# Patient Record
Sex: Female | Born: 1985 | Race: White | Hispanic: No | Marital: Single | Smoking: Current every day smoker
Health system: Southern US, Community
[De-identification: ages and names within clinical notes are randomized; demographics above are authoritative.]

## PROBLEM LIST (undated history)

## (undated) DIAGNOSIS — S42402A Unspecified fracture of lower end of left humerus, initial encounter for closed fracture: Secondary | ICD-10-CM

## (undated) DIAGNOSIS — S42401A Unspecified fracture of lower end of right humerus, initial encounter for closed fracture: Secondary | ICD-10-CM

## (undated) HISTORY — PX: TONSILLECTOMY: SUR1361

## (undated) HISTORY — PX: APPENDECTOMY: SHX54

---
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
## 2005-12-18 ENCOUNTER — Inpatient Hospital Stay (HOSPITAL_COMMUNITY): Admission: AD | Admit: 2005-12-18 | Discharge: 2005-12-18 | Payer: Self-pay | Admitting: Obstetrics & Gynecology

## 2006-01-12 ENCOUNTER — Emergency Department (HOSPITAL_COMMUNITY): Admission: EM | Admit: 2006-01-12 | Discharge: 2006-01-12 | Payer: Self-pay | Admitting: Emergency Medicine

## 2008-04-08 ENCOUNTER — Ambulatory Visit: Payer: Self-pay | Admitting: Family Medicine

## 2008-04-08 ENCOUNTER — Inpatient Hospital Stay (HOSPITAL_COMMUNITY): Admission: AD | Admit: 2008-04-08 | Discharge: 2008-04-09 | Payer: Self-pay | Admitting: Obstetrics and Gynecology

## 2008-04-15 ENCOUNTER — Inpatient Hospital Stay (HOSPITAL_COMMUNITY): Admission: AD | Admit: 2008-04-15 | Discharge: 2008-04-15 | Payer: Self-pay | Admitting: Obstetrics and Gynecology

## 2008-04-18 ENCOUNTER — Inpatient Hospital Stay (HOSPITAL_COMMUNITY): Admission: AD | Admit: 2008-04-18 | Discharge: 2008-04-21 | Payer: Self-pay | Admitting: Obstetrics and Gynecology

## 2008-04-19 ENCOUNTER — Encounter (INDEPENDENT_AMBULATORY_CARE_PROVIDER_SITE_OTHER): Payer: Self-pay | Admitting: Obstetrics and Gynecology

## 2010-01-17 ENCOUNTER — Encounter
Admission: RE | Admit: 2010-01-17 | Discharge: 2010-01-17 | Payer: Self-pay | Source: Home / Self Care | Attending: Family Medicine | Admitting: Family Medicine

## 2010-05-04 LAB — CBC
HCT: 28.1 % — ABNORMAL LOW (ref 36.0–46.0)
HCT: 32.8 % — ABNORMAL LOW (ref 36.0–46.0)
Hemoglobin: 11.4 g/dL — ABNORMAL LOW (ref 12.0–15.0)
Hemoglobin: 9.5 g/dL — ABNORMAL LOW (ref 12.0–15.0)
MCHC: 33.8 g/dL (ref 30.0–36.0)
MCHC: 34.7 g/dL (ref 30.0–36.0)
MCV: 93.8 fL (ref 78.0–100.0)
MCV: 94.7 fL (ref 78.0–100.0)
Platelets: 199 10*3/uL (ref 150–400)
Platelets: 234 10*3/uL (ref 150–400)
RBC: 2.97 MIL/uL — ABNORMAL LOW (ref 3.87–5.11)
RBC: 3.5 MIL/uL — ABNORMAL LOW (ref 3.87–5.11)
RDW: 13.1 % (ref 11.5–15.5)
RDW: 13.1 % (ref 11.5–15.5)
WBC: 17.9 10*3/uL — ABNORMAL HIGH (ref 4.0–10.5)
WBC: 26.3 10*3/uL — ABNORMAL HIGH (ref 4.0–10.5)

## 2010-05-04 LAB — COMPREHENSIVE METABOLIC PANEL
ALT: 13 U/L (ref 0–35)
AST: 19 U/L (ref 0–37)
Albumin: 2.5 g/dL — ABNORMAL LOW (ref 3.5–5.2)
Alkaline Phosphatase: 207 U/L — ABNORMAL HIGH (ref 39–117)
BUN: 5 mg/dL — ABNORMAL LOW (ref 6–23)
CO2: 25 mEq/L (ref 19–32)
Calcium: 9.1 mg/dL (ref 8.4–10.5)
Chloride: 104 mEq/L (ref 96–112)
Creatinine, Ser: 0.6 mg/dL (ref 0.4–1.2)
GFR calc Af Amer: >60 mL/min (ref >60)
GFR calc non Af Amer: >60 mL/min (ref >60)
Glucose, Bld: 96 mg/dL (ref 70–99)
Potassium: 3.8 mEq/L (ref 3.5–5.1)
Sodium: 136 mEq/L (ref 135–145)
Total Bilirubin: 0.3 mg/dL (ref 0.3–1.2)
Total Protein: 5.9 g/dL — ABNORMAL LOW (ref 6.0–8.3)

## 2010-05-04 LAB — LACTATE DEHYDROGENASE: LDH: 186 U/L (ref 94–250)

## 2010-05-04 LAB — RPR: RPR Ser Ql: NONREACTIVE

## 2010-05-04 LAB — URIC ACID: Uric Acid, Serum: 5.3 mg/dL (ref 2.4–7.0)

## 2010-06-06 NOTE — Discharge Summary (Signed)
[name], [name]            ACCOUNT NO.:  [id number]   MEDICAL RECORD NO.:  [id number]          PATIENT TYPE:  INP   LOCATION:  9121                          FACILITY:  [facility]   PHYSICIAN:  Sherron Monday, MD        DATE OF BIRTH:  [birth year]   DATE OF ADMISSION:  04/18/2008  DATE OF DISCHARGE:  04/21/2008                               DISCHARGE SUMMARY   ADMITTING DIAGNOSES:  1. Intrauterine pregnancy at term with spontaneous rupture of      membranes.  2. Pregnancy pruritic urticarial papules and plaques (PUPPs).   DISCHARGE DIAGNOSIS:  Infant delivered via spontaneous vaginal delivery.   HISTORY OF PRESENT ILLNESS:  A 25 year old G1, P0 at 31 and 9 plus weeks  with a last menstrual period consistent with an 8-week ultrasound for an  [facility] of April 23, 2008, presents with increasing contractions with  intensity and frequency.  No vaginal bleeding.  On arrival to the  maternity admissions unit, she had a gush of fluid and was evaluated and  spontaneous rupture of membranes was confirmed.  Vaginal exam was 1-2  cm.  It was dilated, 70% effaced and -2 station with contractions every  3-5 minutes.  Prenatal care was essentially uncomplicated.   PAST MEDICAL HISTORY:  Significant for migraines, depression, and  anxiety.   PAST SURGICAL HISTORY:  Significant for tonsillectomy, adenoidectomy, as  well as appendectomy.   PAST OB/GYN HISTORY:  G1, is present pregnancy.  No abnormal Pap smears  or sexually transmitted diseases.   MEDICATIONS:  None.   ALLERGIES:  No known drug allergies.   SOCIAL HISTORY:  The patient denies alcohol, tobacco, or drug use.   PHYSICAL EXAMINATION:  On admission, she is afebrile.  Vital signs  stable with a mildly elevated blood pressure with normal PIH labs.  Fetal heart tones were reactive with contractions with a vaginal exam of  1-2 cm dilated, 70% effaced, and -2 station. On admission and had been  noted at the tail end of her prenatal care she  had PUPPs that had been  worked up and bile salts were negative.  She had been taking Benadryl  and Atarax to control the itching.   PRENATAL LABORATORIES:  RPR nonreactive.  Hepatitis B surface antigen  negative.  Rubella immune.  HIV negative.  A positive.  Antibody screen  negative.  Gonorrhea negative.  Chlamydia negative.  Cystic fibrosis  screen negative.  Glucola 125.  Group B strep was negative.   Her labor was slow to progress.  Pitocin was started to augment.  A  forebag  was ruptured for clear malodorous fluid.  She continued to  progress in her labor.  Throughout her labor, she had an axillary  temperature of 99.5 and a malodorous rupture of membranes, therefore it  was decided to start ampicillin and gentamicin to treat for probable  chorioamnionitis.  She progressed to complete, plus 2.  She pushed for  approximately an hour to deliver a viable female infant at 25 on the  29th with Apgars of 8 at one minute and 8 at five minutes and  weight of  7 pounds 6 ounces.  No lacerations were noted.  EBL was less than 500  mL.  Placenta was sent to Pathology.  In the course of her labor, she  had moderate variability and occasional late versus early decelerations.  She was closely monitored throughout her labor.  Her postpartum course  was relatively uncomplicated.  She remained afebrile with stable vital  signs throughout.  She was discharged home on postpartum day #2 having  no complaints.  Her pain was well controlled.  She had normal lochia.  She was voiding and ambulating well.  Her hemoglobin decreased from 11.4  to 9.5.   She was discharged to home with prescriptions for Motrin, Vicodin,  prenatal vitamins, routine discharge instructions, and numbers to call  if any questions or problems.   DISCHARGE INFORMATION:  She is A positive.  Rubella immune.  Plans to  breast-feed.  Her hemoglobin decreased from 11.4-9.5 and she wants an  IUD at her postpartum checkup.       Sherron Monday, MD  Electronically Signed     JB/MEDQ  D:  04/21/2008  T:  04/21/2008  Job:  [id number]

## 2010-06-09 NOTE — Op Note (Signed)
NAME:  [name], [name]                      ACCOUNT NO.:  [id number]   MEDICAL RECORD NO.:  [id number]                   PATIENT TYPE:  AMB   LOCATION:  DSC                                  FACILITY:  [facility]   PHYSICIAN:  Jefry H. Pollyann Kennedy, M.D.                DATE OF BIRTH:  10/20/1985   DATE OF PROCEDURE:  09/07/2003  DATE OF DISCHARGE:                                 OPERATIVE REPORT   PREOPERATIVE DIAGNOSIS:  Chronic tonsillitis and recurrent peritonsillar  abscess - bilaterally.   POSTOPERATIVE DIAGNOSIS:  Chronic tonsillitis and recurrent peritonsillar  abscess - bilaterally.   PROCEDURE:  Tonsillectomy.   SURGEON:  Jefry H. Pollyann Kennedy, M.D.   ANESTHESIA:  General endotracheal anesthesia.   COMPLICATIONS:  None.   ESTIMATED BLOOD LOSS:  30 mL.   FINDINGS:  Enlarged tonsils bilaterally, left side with peritonsillar  scarring and granulation tissue on the right side with peritonsillar abscess  containing thick purulent material superiorly in the peritonsillar space.   DISPOSITION:  The patient tolerated the procedure well, was awakened,  extubated, and transferred to the recovery room in stable condition.   HISTORY:  This is an 25 year old girl with a history of recurrent and  chronic tonsillitis who had a left peritonsillar abscess opened and drained  in the emergency department by myself about two weeks prior and as she was  recovering from that, developed a similar abscess on the right side.  The  decision was made to perform tonsillectomy at this point.  The risks,  benefits, alternatives, and complications of the procedure were explained to  the parents who seemed to understand and agreed to the surgery.   PROCEDURE:  The patient was taken to the operating room and placed on the  operating table in supine position.  Following induction of general  endotracheal anesthesia, the table was turned 90 degrees and the patient was  draped in standard fashion.  A Crowe-Davis  mouth gag was inserted into the  oral cavity and used to retract the tongue and mandible and attached to the  Mayo stand.  Red rubber catheter was inserted through the right side of the  nose and withdrawn through the mouth and used to retract the soft palate and  uvula.  Bilateral tonsillectomy was performed using a combination of  electrocautery dissection and blunt dissection using a Pension scheme manager.  Thick  purulent material was obtained from the right peritonsillar space  superiorly.  Approximately 2-3 mL of pus was present.  The tonsils were  removed bilaterally and sent for pathologic evaluation.  Multiple bleeding  sites were cauterized with the  suction cautery and adequate hemostasis was achieved.  The pharynx was  suctioned of blood and secretions, irrigated with saline solution, and an  orogastric tube was used to aspirate the contents of the stomach.  The  patient was then awakened, extubated, and transferred to the recovery room  in stable condition.  Jefry H. Pollyann Kennedy, M.D.    JHR/MEDQ  D:  09/07/2003  T:  09/07/2003  Job:  [id number]

## 2010-06-09 NOTE — Consult Note (Signed)
NAME:  [name], [name]                      ACCOUNT NO.:  [id number]   MEDICAL RECORD NO.:  [id number]                   PATIENT TYPE:  EMS   LOCATION:  MINO                                 FACILITY:  [facility]   PHYSICIAN:  Jefry H. Pollyann Kennedy, M.D.                DATE OF BIRTH:  04-Nov-1985   DATE OF CONSULTATION:  08/16/2003  DATE OF DISCHARGE:  08/16/2003                                   CONSULTATION   REASON FOR CONSULTATION:  Suspected peritonsillar abscess.   HISTORY OF PRESENT ILLNESS:  This is an 25 year old young lady who developed  a sore throat about one week ago.  She was seen at a local [facility].  Strep test was negative and was prescribed Zithromax.  She took two days and  could not finish it due to side-effects mainly GI related.  She then started  getting much worse a couple of days ago and went to the local emergency room  in [facility] where she was prescribed nonsteroidal anti-inflammatory  medications for pain.  She got worse today and was not able to eat or drink  anything.  She has left ear pain and some trismus.  She has no prior history  of tonsil or throat problems.  She is on no medications and has no allergies  to medicine.   PAST MEDICAL HISTORY:  Noncontributory.   PAST SURGICAL HISTORY:  Noncontributory.   PHYSICAL EXAMINATION:  GENERAL:  She is an ill-appearing young lady without  respiratory distress.  HEENT:  She has severe left-sided sore throat.  NECK:  She has some tender adenopathy left upper jugular area.  Oral cavity  and pharynx reveals severe swelling of the left side of the soft palate with  displacement of the uvula.  Tonsil is enlarged as well.  There is some  exudate on the surface.   IMPRESSION:  Suspicious for peritonsillar abscess on the left.   PLAN:  Recommend incision and drainage.   DESCRIPTION OF PROCEDURE:  A 1% Xylocaine with epinephrine was infiltrated  into the left soft palate and anterior fossa arch.  An 18 gauge  needle was  used to aspirate from the peritonsillar space and a large amount of a very  thick purulent material was obtained.  A #11 scalpel was used to incise the  mucosa over the soft palate overlying the peritonsillar plane and tonsillar  hemostat was used to spread open the peritonsillar space where more thick  purulent material was obtained.  After multiple blunt dissections, the  tonsil was completely evacuated from a very deep peritonsillar abscess  cavity.  She tolerated this well and felt some immediate relief.  She was  given a liter of normal saline in the emergency department and this was  going to be repeated if she did not void.   DISCHARGE MEDICATIONS:  1. A prescription given or Augmentin suspension.  2. Lortab elixir.  3. Phenergan suppository  p.r.n.   FOLLOW UP:  She will follow up with me in two to three days or sooner if she  gets any worse.                                               Jefry H. Pollyann Kennedy, M.D.    JHR/MEDQ  D:  08/16/2003  T:  08/16/2003  Job:  [id number]

## 2010-08-03 ENCOUNTER — Emergency Department (HOSPITAL_COMMUNITY)
Admission: EM | Admit: 2010-08-03 | Discharge: 2010-08-03 | Disposition: A | Discharge status: Home / Self Care | Payer: BC Managed Care – PPO | Attending: Emergency Medicine | Admitting: Emergency Medicine

## 2010-08-03 DIAGNOSIS — M549 Dorsalgia, unspecified: Secondary | ICD-10-CM | POA: Insufficient documentation

## 2010-08-03 DIAGNOSIS — R0602 Shortness of breath: Secondary | ICD-10-CM | POA: Insufficient documentation

## 2010-08-03 DIAGNOSIS — F410 Panic disorder [episodic paroxysmal anxiety] without agoraphobia: Secondary | ICD-10-CM | POA: Insufficient documentation

## 2010-08-03 DIAGNOSIS — M26609 Unspecified temporomandibular joint disorder, unspecified side: Secondary | ICD-10-CM | POA: Insufficient documentation

## 2010-08-03 DIAGNOSIS — R079 Chest pain, unspecified: Secondary | ICD-10-CM | POA: Insufficient documentation

## 2010-08-03 DIAGNOSIS — F909 Attention-deficit hyperactivity disorder, unspecified type: Secondary | ICD-10-CM | POA: Insufficient documentation

## 2010-08-03 LAB — RAPID URINE DRUG SCREEN, HOSP PERFORMED
Amphetamines: NOT DETECTED
Barbiturates: NOT DETECTED
Benzodiazepines: NOT DETECTED
Cocaine: NOT DETECTED
Opiates: NOT DETECTED
Tetrahydrocannabinol: NOT DETECTED

## 2010-08-03 LAB — URINALYSIS, ROUTINE W REFLEX MICROSCOPIC
Bilirubin Urine: NEGATIVE
Glucose, UA: NEGATIVE mg/dL
Hgb urine dipstick: NEGATIVE
Ketones, ur: NEGATIVE mg/dL
Specific Gravity, Urine: 1.006 (ref 1.005–1.030)
Urobilinogen, UA: 0.2 mg/dL (ref 0.0–1.0)
pH: 8 (ref 5.0–8.0)

## 2010-08-04 NOTE — ED Provider Notes (Signed)
History     No chief complaint on file.  HPI  No past medical history on file.  No past surgical history on file.  No family history on file.  History  Substance Use Topics  . Smoking status: Not on file  . Smokeless tobacco: Not on file  . Alcohol Use: Not on file    OB History    No data available      Review of Systems  Physical Exam  There were no vitals taken for this visit.  Physical Exam  ED Course  Procedures  MDM Duplicate note, entered in error      Kathleen Melter, MD 08/06/10 [phone]

## 2012-12-03 ENCOUNTER — Emergency Department (HOSPITAL_COMMUNITY)
Admission: EM | Admit: 2012-12-03 | Discharge: 2012-12-03 | Disposition: A | Discharge status: Home / Self Care | Payer: BC Managed Care – PPO | Attending: Emergency Medicine | Admitting: Emergency Medicine

## 2012-12-03 ENCOUNTER — Emergency Department (HOSPITAL_COMMUNITY): Payer: BC Managed Care – PPO

## 2012-12-03 ENCOUNTER — Encounter (HOSPITAL_COMMUNITY): Payer: Self-pay | Admitting: Emergency Medicine

## 2012-12-03 DIAGNOSIS — S93402A Sprain of unspecified ligament of left ankle, initial encounter: Secondary | ICD-10-CM

## 2012-12-03 DIAGNOSIS — R296 Repeated falls: Secondary | ICD-10-CM | POA: Insufficient documentation

## 2012-12-03 DIAGNOSIS — R269 Unspecified abnormalities of gait and mobility: Secondary | ICD-10-CM | POA: Insufficient documentation

## 2012-12-03 DIAGNOSIS — S93409A Sprain of unspecified ligament of unspecified ankle, initial encounter: Secondary | ICD-10-CM | POA: Insufficient documentation

## 2012-12-03 DIAGNOSIS — F17200 Nicotine dependence, unspecified, uncomplicated: Secondary | ICD-10-CM | POA: Insufficient documentation

## 2012-12-03 DIAGNOSIS — Y929 Unspecified place or not applicable: Secondary | ICD-10-CM | POA: Insufficient documentation

## 2012-12-03 DIAGNOSIS — Y9302 Activity, running: Secondary | ICD-10-CM | POA: Insufficient documentation

## 2012-12-03 DIAGNOSIS — X500XXA Overexertion from strenuous movement or load, initial encounter: Secondary | ICD-10-CM | POA: Insufficient documentation

## 2012-12-03 MED ORDER — KETOROLAC TROMETHAMINE 60 MG/2ML IM SOLN
60.0000 mg | Freq: Once | INTRAMUSCULAR | Status: AC
  Administered 2012-12-03: 60 mg via INTRAMUSCULAR
  Filled 2012-12-03: qty 2

## 2012-12-03 MED ORDER — KETOROLAC TROMETHAMINE 10 MG PO TABS: 10.0000 mg | ORAL_TABLET | Freq: Four times a day (QID) | ORAL | Status: DC | PRN

## 2012-12-03 NOTE — ED Provider Notes (Signed)
CSN: [id number]     Arrival date & time 12/03/12  1001 History   First MD Initiated Contact with Patient 12/03/12 1039     Chief Complaint  Patient presents with  . Foot Pain   (Consider location/radiation/quality/duration/timing/severity/associated sxs/prior Treatment) HPI Comments: Patient is a 27 year old female who presents today for left ankle pain. While running 3 miles yesterday she fell at about a mile and a half. She had an inversion injury to her left ankle. She continued with her run, but the pain continued to worsen. By the end of her run she was barely able to bear weight on her left foot. She took ibuprofen last night with little relief to her pain. Her pain is sharp and radiates up into her leg when she bears weight. She has never injured this ankle in the past. No fever, chills, nausea, vomiting, abdominal pain, chest pain, shortness of breath, numbness, weakness, paresthesias.  Patient is a 27 y.o. female presenting with lower extremity pain. The history is provided by the patient. No language interpreter was used.  Foot Pain Associated symptoms include arthralgias. Pertinent negatives include no chest pain, chills or fever.    History reviewed. No pertinent past medical history. History reviewed. No pertinent past surgical history. History reviewed. No pertinent family history. History  Substance Use Topics  . Smoking status: Current Every Day Smoker  . Smokeless tobacco: Not on file  . Alcohol Use: Yes     Comment: occ   OB History   Grav Para Term Preterm Abortions TAB SAB Ect Mult Living                 Review of Systems  Constitutional: Negative for fever and chills.  Respiratory: Negative for shortness of breath.   Cardiovascular: Negative for chest pain.  Musculoskeletal: Positive for arthralgias and gait problem.  All other systems reviewed and are negative.    Allergies  Review of patient's allergies indicates not on file.  Home Medications    Current Outpatient Rx  Name  Route  Sig  Dispense  Refill  . ketorolac (TORADOL) 10 MG tablet   Oral   Take 1 tablet (10 mg total) by mouth every 6 (six) hours as needed.   20 tablet   0    BP 133/73  Pulse 96  Temp(Src) 97.8 F (36.6 C) (Oral)  Resp 18  Ht 5\' 7"  (1.702 m)  Wt 156 lb (70.761 kg)  BMI 24.43 kg/m2  SpO2 98% Physical Exam  Nursing note and vitals reviewed. Constitutional: She is oriented to person, place, and time. She appears well-developed and well-nourished. No distress.  HENT:  Head: Normocephalic and atraumatic.  Right Ear: External ear normal.  Left Ear: External ear normal.  Nose: Nose normal.  Mouth/Throat: Oropharynx is clear and moist.  Eyes: Conjunctivae are normal.  Neck: Normal range of motion.  Cardiovascular: Normal rate, regular rhythm, normal heart sounds, intact distal pulses and normal pulses.   Pulses:      Dorsalis pedis pulses are 2+ on the right side, and 2+ on the left side.       Posterior tibial pulses are 2+ on the right side, and 2+ on the left side.  Pulmonary/Chest: Effort normal and breath sounds normal. No stridor. No respiratory distress. She has no wheezes. She has no rales.  Abdominal: Soft. She exhibits no distension.  Musculoskeletal: Normal range of motion.       Feet:  Tender to palpation over lateral aspect of left  ankle. Mild bruising seen at the lateral aspect inferior to the lateral malleolus. Neurovascularly intact. Sensation intact. Compartment soft. Range of motion mildly limited due to pain.  Neurological: She is alert and oriented to person, place, and time. She has normal strength.  Skin: Skin is warm and dry. She is not diaphoretic. No erythema.  Psychiatric: She has a normal mood and affect. Her behavior is normal.    ED Course  Procedures (including critical care time) Labs Review Labs Reviewed - No data to display Imaging Review Dg Foot Complete Left  12/03/2012   CLINICAL DATA:  Left foot injury  now with lateral pain with weight-bearing.  EXAM: LEFT FOOT - COMPLETE 3+ VIEW  COMPARISON:  None.  FINDINGS: The bones of the foot appear adequately mineralized for age. There is no evidence of an acute phalanx healed fracture. The metatarsals are intact. Specific attention to the 5th metatarsal reveals no acute abnormality. The bones of the hindfoot also appear intact.  IMPRESSION: There is no acute bony abnormality of the left foot   Electronically Signed   By: David  [place]   On: 12/03/2012 10:36    EKG Interpretation   None       MDM   1. Ankle sprain, left, initial encounter    Imaging shows no fracture. Directed pt to ice injury, take acetaminophen or ibuprofen for pain, and to elevate and rest the injury when possible. Patient was given crutches and ASO brace. She is neurovascularly intact and the compartment is soft. Return instructions given. Vital signs stable for discharge. Patient / Family / Caregiver informed of clinical course, understand medical decision-making process, and agree with plan.   Mora Bellman, PA-C 12/03/12 1110

## 2012-12-03 NOTE — ED Provider Notes (Signed)
Medical screening examination/treatment/procedure(s) were performed by non-physician practitioner and as supervising physician I was immediately available for consultation/collaboration.  EKG Interpretation   None        Jia Dottavio, MD 12/03/12 1354 

## 2012-12-03 NOTE — ED Notes (Signed)
Pt c/o pain to left lateral foot, started after rolling her foot while she was running. Pt sts initially the pain wasn't bad and she continued running but the next day the pain increased. Pt in nad, skin warm and dry, resp e/u.

## 2012-12-03 NOTE — ED Notes (Signed)
Pt c/o left foot pain after twisting while running yesterday; CMS intact

## 2013-12-22 ENCOUNTER — Encounter (HOSPITAL_COMMUNITY): Payer: Self-pay

## 2013-12-22 ENCOUNTER — Emergency Department (HOSPITAL_COMMUNITY)
Admission: EM | Admit: 2013-12-22 | Discharge: 2013-12-22 | Disposition: A | Discharge status: Home / Self Care | Payer: Medicaid Other | Attending: Emergency Medicine | Admitting: Emergency Medicine

## 2013-12-22 ENCOUNTER — Emergency Department (HOSPITAL_COMMUNITY): Payer: Medicaid Other

## 2013-12-22 DIAGNOSIS — F458 Other somatoform disorders: Secondary | ICD-10-CM | POA: Insufficient documentation

## 2013-12-22 DIAGNOSIS — Z791 Long term (current) use of non-steroidal anti-inflammatories (NSAID): Secondary | ICD-10-CM | POA: Insufficient documentation

## 2013-12-22 DIAGNOSIS — H9209 Otalgia, unspecified ear: Secondary | ICD-10-CM | POA: Diagnosis not present

## 2013-12-22 DIAGNOSIS — R0989 Other specified symptoms and signs involving the circulatory and respiratory systems: Secondary | ICD-10-CM

## 2013-12-22 DIAGNOSIS — Z720 Tobacco use: Secondary | ICD-10-CM | POA: Insufficient documentation

## 2013-12-22 DIAGNOSIS — J029 Acute pharyngitis, unspecified: Secondary | ICD-10-CM | POA: Diagnosis present

## 2013-12-22 DIAGNOSIS — R070 Pain in throat: Secondary | ICD-10-CM

## 2013-12-22 MED ORDER — GI COCKTAIL ~~LOC~~
30.0000 mL | Freq: Once | ORAL | Status: AC
  Administered 2013-12-22: 30 mL via ORAL
  Filled 2013-12-22: qty 30

## 2013-12-22 NOTE — ED Provider Notes (Signed)
CSN: [id number]     Arrival date & time 12/22/13  1952 History  This chart was scribed for non-physician practitioner, Francee PiccoloJennifer Rondia Higginbotham, PA-C working with Gerhard Munchobert Lockwood, MD by Greggory StallionKayla Andersen, ED scribe. This patient was seen in room TR09C/TR09C and the patient's care was started at 8:31 PM.   Chief Complaint  Patient presents with  . Sore Throat   The history is provided by the patient. No language interpreter was used.    HPI Comments: [name] is a 28 y.o. female who presents to the Emergency Department complaining of sore throat that started one month ago. It feels like something is stuck in her throat. She has had the same symptoms intermittently over the last year. Reports some ear pain that started last night. States she went to urgent care for the same where blood work was done but nothing else. Denies fever, drooling, choking, emesis. Denies sick contacts at home. Pt does not have a PCP but sees her OB.   History reviewed. No pertinent past medical history. Past Surgical History  Procedure Laterality Date  . Appendectomy    . Tonsillectomy     History reviewed. No pertinent family history. History  Substance Use Topics  . Smoking status: Current Every Day Smoker  . Smokeless tobacco: Not on file  . Alcohol Use: Yes     Comment: occ   OB History    No data available     Review of Systems  Constitutional: Negative for fever.  HENT: Positive for ear pain and sore throat. Negative for drooling.   Gastrointestinal: Negative for vomiting.  All other systems reviewed and are negative.  Allergies  Peanut-containing drug products  Home Medications   Prior to Admission medications   Medication Sig Start Date End Date Taking? Authorizing Provider  ketorolac (TORADOL) 10 MG tablet Take 1 tablet (10 mg total) by mouth every 6 (six) hours as needed. 12/03/12   Mora BellmanHannah S Merrell, PA-C  lisdexamfetamine (VYVANSE) 60 MG capsule Take 60 mg by mouth every morning.     Historical Provider, MD   BP 123/87 mmHg  Pulse 87  Temp(Src) 98.6 F (37 C) (Oral)  Resp 18  Ht 5\' 7"  (1.702 m)  Wt 137 lb (62.143 kg)  BMI 21.45 kg/m2  SpO2 97%   Physical Exam  Constitutional: She is oriented to person, place, and time. She appears well-developed and well-nourished. No distress.  HENT:  Head: Normocephalic and atraumatic.  Right Ear: Tympanic membrane and external ear normal.  Left Ear: Tympanic membrane and external ear normal.  Nose: Nose normal.  Mouth/Throat: Oropharynx is clear and moist. No oropharyngeal exudate.  Eyes: Conjunctivae are normal.  Neck: Normal range of motion. Neck supple. No tracheal deviation present. No thyromegaly present.  Cardiovascular: Normal rate, regular rhythm and normal heart sounds.   Pulmonary/Chest: Effort normal and breath sounds normal. No stridor. No respiratory distress.  Abdominal: Soft.  Musculoskeletal: Normal range of motion.  Lymphadenopathy:    She has no cervical adenopathy.  Neurological: She is alert and oriented to person, place, and time.  Skin: Skin is warm and dry. She is not diaphoretic.  Psychiatric: She has a normal mood and affect.  Nursing note and vitals reviewed.   ED Course  Procedures (including critical care time)  DIAGNOSTIC STUDIES: Oxygen Saturation is 100% on RA, normal by my interpretation.    COORDINATION OF CARE: 8:33 PM-Discussed treatment plan which includes neck xray with pt at bedside and pt agreed to plan.  Advised pt to follow up with her OB for further blood work.   Labs Review Labs Reviewed - No data to display  Imaging Review Dg Neck Soft Tissue  12/22/2013   CLINICAL DATA:  Acute throat pain, difficulty swallowing  EXAM: NECK SOFT TISSUES - 1+ VIEW  COMPARISON:  None.  FINDINGS: There is no evidence of retropharyngeal soft tissue swelling or epiglottic enlargement. The cervical airway is unremarkable and no radio-opaque foreign body identified.  IMPRESSION: Negative.    Electronically Signed   By: Ruel Favorsrevor  Shick M.D.   On: 12/22/2013 21:49     EKG Interpretation None      MDM   Final diagnoses:  Globus sensation    Filed Vitals:   12/22/13 2249  BP: 123/87  Pulse: 87  Temp: 98.6 F (37 C)  Resp: 18   Afebrile, NAD, non-toxic appearing, AAOx4.  No signs of respiratory distress. Oropharynx clear. No throat mass appreciated. No gross thyroid abnormality noted. X-ray unremarkable for FB. Advised PCP f/u for further evaluation. Return precautions discussed. Patient is agreeable to plan.  Patient is stable at time of discharge   I personally performed the services described in this documentation, which was scribed in my presence. The recorded information has been reviewed and is accurate.  [name], PA-C 12/22/13 2259  Gerhard Munchobert Lockwood, MD 12/23/13 Moses [email or web address]

## 2013-12-22 NOTE — Discharge Instructions (Signed)
Please follow up with your primary care physician in 1-2 days. If you do not have one please [facility] the [facility] listed above. Please read all discharge instructions and return precautions.   Globus Syndrome Globus Syndrome is a feeling of a lump or a sensation of something caught in your throat. Eating food or drinking fluids does not seem to get rid of it. Yet it is not noticeable during the actual act of swallowing food or liquids. Usually there is nothing physically wrong. It is troublesome because it is an unpleasant sensation which is sometimes difficult to ignore and at times may seem to worsen. The syndrome is quite common. It is estimated 45% of the population experiences features of the condition at some stage during their lives. The symptoms are usually temporary. The largest group of people who feel the need to seek medical treatment is females between the ages of 130 to 2560.  CAUSES  Globus Syndrome appears to be triggered by or aggravated by stress, anxiety and depression.  Tension related to stress could product abnormal muscle spasms in the esophagus which would account for the sensation of a lump or ball in your throat.  Frequent swallowing or drying of the throat caused by anxiety or other strong emotions can also produce this uncomfortable sensation in your throat.  Fear and sadness can be expressed by the body in many ways. For instance, if you had a relative with throat cancer you might become overly concerned about your own health and develop uncomfortable sensations in your throat.  The reaction to a crisis or a trauma event in your life can take the form of a lump in your throat. It is as if you are indirectly saying you can not handle or "swallow" one more thing. DIAGNOSIS  Usually your caregiver will know what is wrong by talking to you and examining you. If the condition persists for several days, more testing may be done to make sure there is not  another problem present. This is usually not the case. TREATMENT   Reassurance is often the best treatment available. Usually the problem leaves without treatment over several days.  Sometimes anti-anxiety medications may be prescribed.  Counseling or talk therapy can also help with strong underlying emotions.  Note that in most cases this is not something that keeps coming back and you should not be concerned or worried. Document Released: 03/31/2003 Document Revised: 04/02/2011 Document Reviewed: 08/28/2007 [facility] Patient Information 2015 [employer] [place]. This information is not intended to replace advice given to you by your health care provider. Make sure you discuss any questions you have with your health care provider.

## 2013-12-22 NOTE — ED Notes (Signed)
Pt from home with pain to her throat.  She feels like there is something stuck in her throat.  Went to urgent care where they did blood work but nothing else.  Pt anxious in triage room.  No drooling noted, airway intact. No tenderness palpated to throat.

## 2014-02-24 ENCOUNTER — Encounter (HOSPITAL_COMMUNITY): Payer: Self-pay | Admitting: Emergency Medicine

## 2014-02-24 ENCOUNTER — Emergency Department (HOSPITAL_COMMUNITY)
Admission: EM | Admit: 2014-02-24 | Discharge: 2014-02-24 | Disposition: A | Discharge status: Home / Self Care | Payer: Medicaid Other | Source: Home / Self Care | Attending: Family Medicine | Admitting: Family Medicine

## 2014-02-24 DIAGNOSIS — S29011A Strain of muscle and tendon of front wall of thorax, initial encounter: Secondary | ICD-10-CM

## 2014-02-24 MED ORDER — DICLOFENAC POTASSIUM 50 MG PO TABS: 50.0000 mg | ORAL_TABLET | Freq: Three times a day (TID) | ORAL | Status: DC

## 2014-02-24 MED ORDER — CYCLOBENZAPRINE HCL 5 MG PO TABS: 5.0000 mg | ORAL_TABLET | Freq: Three times a day (TID) | ORAL | Status: DC | PRN

## 2014-02-24 NOTE — ED Notes (Signed)
Pt states that she was shoveling snow 2 weeks ago and since she has had pain and numbness in her left arm.

## 2014-02-24 NOTE — Discharge Instructions (Signed)
Heat and medicine as needed, return as needed

## 2014-02-24 NOTE — ED Provider Notes (Signed)
CSN: [id number]     Arrival date & time 02/24/14  1747 History   First MD Initiated Contact with Patient 02/24/14 1825     Chief Complaint  Patient presents with  . Chest Pain  . Arm Pain   (Consider location/radiation/quality/duration/timing/severity/associated sxs/prior Treatment) Patient is a 29 y.o. female presenting with chest pain and arm pain. The history is provided by the patient.  Chest Pain Pain location:  L lateral chest Pain quality: sharp   Pain radiates to:  Does not radiate Pain radiates to the back: no   Pain severity:  Mild Onset quality:  Gradual Duration:  2 weeks Progression:  Unchanged Chronicity:  New Context: movement and raising an arm   Ineffective treatments: heat. Associated symptoms: no back pain, no cough, no fever, no nausea, no shortness of breath and no weakness   Associated symptoms comment:  Onset 2 days after spreading salt during snow event, no rash, h/o right pelvic shingles-doesn't feel the same but unable to wear bra. Arm Pain Associated symptoms include chest pain. Pertinent negatives include no shortness of breath.    History reviewed. No pertinent past medical history. Past Surgical History  Procedure Laterality Date  . Appendectomy    . Tonsillectomy     History reviewed. No pertinent family history. History  Substance Use Topics  . Smoking status: Current Every Day Smoker  . Smokeless tobacco: Not on file  . Alcohol Use: Yes     Comment: occ   OB History    No data available     Review of Systems  Constitutional: Negative for fever.  Respiratory: Negative.  Negative for cough and shortness of breath.   Cardiovascular: Positive for chest pain. Negative for leg swelling.  Gastrointestinal: Negative.  Negative for nausea.  Musculoskeletal: Negative for back pain.  Skin: Negative for rash.  Neurological: Negative for weakness.    Allergies  Peanut-containing drug products  Home Medications   Prior to Admission  medications   Medication Sig Start Date End Date Taking? Authorizing Provider  cyclobenzaprine (FLEXERIL) 5 MG tablet Take 1 tablet (5 mg total) by mouth 3 (three) times daily as needed for muscle spasms. 02/24/14   Linna HoffJames D Sandara Tyree, MD  diclofenac (CATAFLAM) 50 MG tablet Take 1 tablet (50 mg total) by mouth 3 (three) times daily. For pain 02/24/14   Linna HoffJames D Rosario Duey, MD  ketorolac (TORADOL) 10 MG tablet Take 1 tablet (10 mg total) by mouth every 6 (six) hours as needed. 12/03/12   Mora BellmanHannah S Merrell, PA-C  lisdexamfetamine (VYVANSE) 60 MG capsule Take 60 mg by mouth every morning.    Historical Provider, MD   BP 129/83 mmHg  Pulse 66  Temp(Src) 97.8 F (36.6 C) (Oral)  Resp 16  SpO2 99% Physical Exam  Constitutional: She is oriented to person, place, and time. She appears well-developed and well-nourished. She appears distressed.  Tearful   Neck: Normal range of motion. Neck supple.  Cardiovascular: Normal rate, regular rhythm, normal heart sounds and intact distal pulses.   Pulmonary/Chest: Effort normal and breath sounds normal. She exhibits tenderness.  Neurological: She is oriented to person, place, and time.  Skin: Skin is warm and dry. No rash noted. No erythema.  Nursing note and vitals reviewed.   ED Course  Procedures (including critical care time) Labs Review Labs Reviewed - No data to display  Imaging Review No results found.   MDM   1. Chest wall muscle strain, initial encounter  Linna Hoff, MD 03/01/14 2010

## 2014-04-30 ENCOUNTER — Encounter (HOSPITAL_COMMUNITY): Payer: Self-pay | Admitting: *Deleted

## 2014-04-30 ENCOUNTER — Emergency Department (INDEPENDENT_AMBULATORY_CARE_PROVIDER_SITE_OTHER)
Admission: EM | Admit: 2014-04-30 | Discharge: 2014-04-30 | Disposition: A | Discharge status: Home / Self Care | Payer: Medicaid Other | Source: Home / Self Care | Attending: Family Medicine | Admitting: Family Medicine

## 2014-04-30 ENCOUNTER — Emergency Department (INDEPENDENT_AMBULATORY_CARE_PROVIDER_SITE_OTHER): Payer: Medicaid Other

## 2014-04-30 DIAGNOSIS — S62309A Unspecified fracture of unspecified metacarpal bone, initial encounter for closed fracture: Secondary | ICD-10-CM

## 2014-04-30 DIAGNOSIS — S62339A Displaced fracture of neck of unspecified metacarpal bone, initial encounter for closed fracture: Secondary | ICD-10-CM

## 2014-04-30 HISTORY — DX: Unspecified fracture of lower end of right humerus, initial encounter for closed fracture: S42.401A

## 2014-04-30 HISTORY — DX: Unspecified fracture of lower end of left humerus, initial encounter for closed fracture: S42.402A

## 2014-04-30 MED ORDER — IBUPROFEN 800 MG PO TABS
800.0000 mg | ORAL_TABLET | Freq: Once | ORAL | Status: AC
  Administered 2014-04-30: 800 mg via ORAL

## 2014-04-30 MED ORDER — HYDROCODONE-ACETAMINOPHEN 5-325 MG PO TABS: 2.0000 | ORAL_TABLET | Freq: Four times a day (QID) | ORAL | Status: DC | PRN

## 2014-04-30 MED ORDER — IBUPROFEN 800 MG PO TABS
ORAL_TABLET | ORAL | Status: AC
  Filled 2014-04-30: qty 1

## 2014-04-30 NOTE — Progress Notes (Signed)
Orthopedic Tech Progress Note Patient Details:  [name] [birth year] [id number]  Ortho Devices Type of Ortho Device: Arm sling, Ulna gutter splint Ortho Device/Splint Location: rue Ortho Device/Splint Interventions: Application   Thao Vanover 04/30/2014, 10:13 AM

## 2014-04-30 NOTE — ED Provider Notes (Signed)
CSN: [id number]     Arrival date & time 04/30/14  [id number] History   First MD Initiated Contact with Patient 04/30/14 [phone]     Chief Complaint  Patient presents with  . Hand Injury   (Consider location/radiation/quality/duration/timing/severity/associated sxs/prior Treatment) HPI Comments: Patient states she is currently in the process of moving to a new home and was carrying a box into the garage yesterday when she stumbled and fell and landed on her right hand. Injured hand has remained painful and swollen since fall.  Unemployed Right hand dominant Smoker No previous injury or surgery  Patient is a 29 y.o. female presenting with hand injury. The history is provided by the patient.  Hand Injury   Past Medical History  Diagnosis Date  . Elbow fracture, right   . Elbow fracture, left    Past Surgical History  Procedure Laterality Date  . Appendectomy    . Tonsillectomy     History reviewed. No pertinent family history. History  Substance Use Topics  . Smoking status: Current Every Day Smoker -- 0.50 packs/day  . Smokeless tobacco: Not on file  . Alcohol Use: Yes     Comment: occ   OB History    No data available     Review of Systems  All other systems reviewed and are negative.   Allergies  Peanut-containing drug products  Home Medications   Prior to Admission medications   Medication Sig Start Date End Date Taking? Authorizing Provider  ALPRAZolam Prudy Feeler(XANAX) 0.25 MG tablet Take 0.25 mg by mouth at bedtime as needed for anxiety.   Yes Historical Provider, MD  escitalopram (LEXAPRO) 10 MG tablet Take 10 mg by mouth daily.   Yes Historical Provider, MD  cyclobenzaprine (FLEXERIL) 5 MG tablet Take 1 tablet (5 mg total) by mouth 3 (three) times daily as needed for muscle spasms. 02/24/14   Linna HoffJames D Kindl, MD  diclofenac (CATAFLAM) 50 MG tablet Take 1 tablet (50 mg total) by mouth 3 (three) times daily. For pain 02/24/14   Linna HoffJames D Kindl, MD  ketorolac (TORADOL) 10 MG tablet Take  1 tablet (10 mg total) by mouth every 6 (six) hours as needed. 12/03/12   Junious SilkHannah Merrell, PA-C  lisdexamfetamine (VYVANSE) 60 MG capsule Take 60 mg by mouth every morning.    Historical Provider, MD   BP 118/76 mmHg  Pulse 73  Temp(Src) 97.6 F (36.4 C) (Oral)  Resp 14  SpO2 100% Physical Exam  Constitutional: She is oriented to person, place, and time. She appears well-developed and well-nourished. No distress.  HENT:  Head: Normocephalic and atraumatic.  Cardiovascular: Normal rate.   Pulmonary/Chest: Effort normal.  Musculoskeletal:       Right hand: She exhibits decreased range of motion, tenderness, bony tenderness and swelling. She exhibits normal two-point discrimination, normal capillary refill, no deformity and no laceration. Normal sensation noted. Normal strength noted.       Hands: Neurological: She is alert and oriented to person, place, and time.  Skin: Skin is warm and dry.  Psychiatric: She has a normal mood and affect. Her behavior is normal.  Nursing note and vitals reviewed.   ED Course  Procedures (including critical care time) Labs Review Labs Reviewed - No data to display  Imaging Review Dg Hand Complete Right  04/30/2014   CLINICAL DATA:  Fall down a flight of stairs with injury to the right hand, pain in the small finger and adjacent metacarpal and extending towards the wrist.  EXAM: RIGHT HAND -  COMPLETE 3+ VIEW  COMPARISON:  01/12/2006  FINDINGS: Mildly comminuted fracture of the distal fifth metacarpal with transverse an oblique components in the shaft and extending to the distal metaphysis. No involvement of the MCP joint.  No other fractures are identified.  IMPRESSION: 1. Mildly comminuted boxer's type fracture of the fifth metacarpal.   Electronically Signed   By: Gaylyn Rong M.D.   On: 04/30/2014 09:27     MDM   1. Boxer's fracture, closed, initial encounter   Films as above Ice & Elevation Sling and ulnar gutter splint Ibuprofen for  pain Follow up hand ortho (Dr. Mina Marble)    Ria Clock, [place] 04/30/14 1004

## 2014-04-30 NOTE — ED Notes (Signed)
Pt is here with complaints of right hand injury. Pt fell last night. Hand is swollen, brusied, and deformed.

## 2014-04-30 NOTE — Discharge Instructions (Signed)
Boxer's Fracture °You have a break (fracture) of the fifth metacarpal bone. This is commonly called a boxer's fracture. This is the bone in the hand where the little finger attaches. The fracture is in the end of that bone, closest to the little finger. It is usually caused when you hit an object with a clenched fist. Often, the knuckle is pushed down by the impact. Sometimes, the fracture rotates out of position. A boxer's fracture will usually heal within 6 weeks, if it is treated properly and protected from re-injury. Surgery is sometimes needed. °A cast, splint, or bulky hand dressing may be used to protect and immobilize a boxer's fracture. Do not remove this device or dressing until your caregiver approves. Keep your hand elevated, and apply ice packs for 15-20 minutes every 2 hours, for the first 2 days. Elevation and ice help reduce swelling and relieve pain. See your caregiver, or an orthopedic specialist, for follow-up care within the next 10 days. This is to make sure your fracture is healing properly. °Document Released: 01/08/2005 Document Revised: 04/02/2011 Document Reviewed: 06/28/2006 °ExitCare® Patient Information ©2015 [employer]. This information is not intended to replace advice given to you by your health care provider. Make sure you discuss any questions you have with your health care provider. ° °Cast or Splint Care °Casts and splints support injured limbs and keep bones from moving while they heal. It is important to care for your cast or splint at home.   °HOME CARE INSTRUCTIONS °· Keep the cast or splint uncovered during the drying period. It can take 24 to 48 hours to dry if it is made of plaster. A fiberglass cast will dry in less than 1 hour. °· Do not rest the cast on anything harder than a pillow for the first 24 hours. °· Do not put weight on your injured limb or apply pressure to the cast until your health care provider gives you permission. °· Keep the cast or splint dry. Wet  casts or splints can lose their shape and may not support the limb as well. A wet cast that has lost its shape can also create harmful pressure on your skin when it dries. Also, wet skin can become infected. °¨ Cover the cast or splint with a plastic bag when bathing or when out in the rain or snow. If the cast is on the trunk of the body, take sponge baths until the cast is removed. °¨ If your cast does become wet, dry it with a towel or a blow dryer on the cool setting only. °· Keep your cast or splint clean. Soiled casts may be wiped with a moistened cloth. °· Do not place any hard or soft foreign objects under your cast or splint, such as cotton, toilet paper, lotion, or powder. °· Do not try to scratch the skin under the cast with any object. The object could get stuck inside the cast. Also, scratching could lead to an infection. If itching is a problem, use a blow dryer on a cool setting to relieve discomfort. °· Do not trim or cut your cast or remove padding from inside of it. °· Exercise all joints next to the injury that are not immobilized by the cast or splint. For example, if you have a long leg cast, exercise the hip joint and toes. If you have an arm cast or splint, exercise the shoulder, elbow, thumb, and fingers. °· Elevate your injured arm or leg on 1 or 2 pillows for the   first 1 to 3 days to decrease swelling and pain. It is best if you can comfortably elevate your cast so it is higher than your heart. °SEEK MEDICAL CARE IF:  °· Your cast or splint cracks. °· Your cast or splint is too tight or too loose. °· You have unbearable itching inside the cast. °· Your cast becomes wet or develops a soft spot or area. °· You have a bad smell coming from inside your cast. °· You get an object stuck under your cast. °· Your skin around the cast becomes red or raw. °· You have new pain or worsening pain after the cast has been applied. °SEEK IMMEDIATE MEDICAL CARE IF:  °· You have fluid leaking through the  cast. °· You are unable to move your fingers or toes. °· You have discolored (blue or white), cool, painful, or very swollen fingers or toes beyond the cast. °· You have tingling or numbness around the injured area. °· You have severe pain or pressure under the cast. °· You have any difficulty with your breathing or have shortness of breath. °· You have chest pain. °Document Released: 01/06/2000 Document Revised: 10/29/2012 Document Reviewed: 07/17/2012 °ExitCare® Patient Information ©2015 [employer]. This information is not intended to replace advice given to you by your health care provider. Make sure you discuss any questions you have with your health care provider. ° °

## 2014-05-05 ENCOUNTER — Ambulatory Visit: Payer: Medicaid Other | Attending: Orthopedic Surgery | Admitting: Occupational Therapy

## 2014-05-05 DIAGNOSIS — M25641 Stiffness of right hand, not elsewhere classified: Secondary | ICD-10-CM | POA: Diagnosis not present

## 2014-05-05 DIAGNOSIS — M79641 Pain in right hand: Secondary | ICD-10-CM | POA: Insufficient documentation

## 2014-05-05 NOTE — Therapy (Signed)
[facility] [address] [place], [place], [address] Phone: [phone]   Fax:  [phone]  Occupational Therapy Evaluation  Patient Details  Name: [name] MRN: [id number] Date of Birth: 1985/06/26 Referring Provider:  Dairl PonderWeingold, Matthew, MD  Encounter Date: 05/05/2014      OT End of Session - 05/05/14 1114    Visit Number 1   Number of Visits 4   Date for OT Re-Evaluation 07/05/14   Authorization Type Medicaid   OT Start Time 1107   OT Stop Time 1213   OT Time Calculation (min) 66 min   Activity Tolerance Patient tolerated treatment well   Behavior [facility] for tasks assessed/performed      Past Medical History  Diagnosis Date  . Elbow fracture, right   . Elbow fracture, left     Past Surgical History  Procedure Laterality Date  . Appendectomy    . Tonsillectomy      There were no vitals filed for this visit.  Visit Diagnosis:  Pain of right hand  Stiffness of hand joint, right      Subjective Assessment - 05/05/14 1112    Subjective  Pt reports she caught herself with RUE   Currently in Pain? Yes   Pain Score 5    Pain Location Hand   Pain Orientation Right   Pain Descriptors / Indicators Aching   Aggravating Factors  movement   Pain Relieving Factors meds   Effect of Pain on Daily Activities limits daily activities   Multiple Pain Sites No           [facility] OT Assessment - 05/05/14 0001    Assessment   Diagnosis s/p right 5th metacarpal fx on 04/29/14   Onset Date 04/29/14   Assessment Pt s/p fall down stairs and subsequent right 5th metacarpal fx presents with pain and limited RUE functional use.   Prior Therapy n/a   Precautions   Precautions Other (comment)   Precaution Comments splint at all times except for hand hygeine.   Required Braces or Orthoses Other Brace/Splint  clamshell splint   Restrictions   Weight Bearing Restrictions Yes   RUE Weight Bearing  --  no heavy weightbearing   Balance Screen   Has the patient fallen in the past 6 months Yes   How many times? 1   Has the patient had a decrease in activity level because of a fear of falling?  No   Is the patient reluctant to leave their home because of a fear of falling?  No   Home  Environment   Family/patient expects to be discharged to: Private residence   Living Arrangements Spouse/significant other   Available Help at Discharge Friend(s)   Lives With Significant other;Daughter   Prior Function   Level of Independence Independent with homemaking with ambulation   Vocation Unemployed   ADL   ADL comments Pt performs basic ADLS modified indpendently with LUE   Mobility   Mobility Status Independent   Sensation   Light Touch Appears Intact   Edema   Edema mild in right hand with bruising present   ROM / Strength   AROM / PROM / Strength --  not tested due to precautions.      Treatment: Pt  Arrived wearing protective cast. She was carefully unwrapped and hand was cleaned with soap and water. Pt was fitted with a buddy strap for digits 4, 5 and with a clamshell hand based splint  including MP's for digits 4,5 in approximately 30* flexion. Pt verbalized understanding of splint wear, care and precautions. OT will only address pain through splinting.                   OT Education - 05/05/14 1248    Education provided Yes   Education Details splint wear, care and precautions   Person(s) Educated Patient   Methods Explanation;Demonstration;Verbal cues;Handout   Comprehension Verbalized understanding;Returned demonstration             OT Long Term Goals - 05/05/14 1253    OT LONG TERM GOAL #1   Title I with splint wear, care and precuations following 1-2 weeks of wear to ensure proper fit.   Baseline dependent, splint issued on inital visit   Time 8   Period Weeks   Status New               Plan - 05/05/14 1250    Clinical Impression  Statement Pt s/p right 5th metacarpal fx presents with pain and decreased functional use of RUE. Pt can benefit from skilled OT for splinting.   Pt will benefit from skilled therapeutic intervention in order to improve on the following deficits (Retired) Decreased coordination;Decreased range of motion;Impaired flexibility;Decreased endurance;Increased edema;Pain;Impaired UE functional use   Rehab Potential Good   Clinical Impairments Affecting Rehab Potential pain   OT Frequency --  4 visits plus eval    OT Duration 8 weeks   OT Treatment/Interventions Self-care/ADL training;Moist Heat;Patient/family education;Splinting;Cryotherapy   Plan splint check and modifications PRN   Consulted and Agree with Plan of Care Patient        Problem List There are no active problems to display for this patient.   [name] 05/05/2014, 12:57 PM Keene Breath, OTR/L [phone] Phone: [phone] 12:57 PM 04/13/2016[facility] [address] [place], [place], [address] Phone: [phone]   Fax:  [phone]

## 2014-05-05 NOTE — Patient Instructions (Addendum)
WEARING SCHEDULE:  Wear splint at ALL times except for hygiene care  PURPOSE:  To prevent movement and for protection until injury can heal  CARE OF SPLINT:  Keep splint away from heat sources including: stove, radiator or furnace, or a car in sunlight. The splint can melt and will no longer fit you properly  Keep away from pets and children  Clean the splint with rubbing alcohol 1-2 times per day. Apply buddy strap to ring and small finger. * During this time, make sure you also clean your hand/arm as instructed by your therapist and/or perform dressing changes as needed. Then dry hand/arm completely before replacing splint. (When cleaning hand/arm, keep it immobilized in same position until splint is replaced)  PRECAUTIONS/POTENTIAL PROBLEMS: *If you notice or experience increased pain, swelling, numbness, or a lingering reddened area from the splint: Contact your therapist immediately by calling [phone]. You must wear the splint for protection, but we will get you scheduled for adjustments as quickly as possible.  (If only straps or hooks need to be replaced and NO adjustments to the splint need to be made, just call the office ahead and let them know you are coming in)  If you have any medical concerns or signs of infection, please call your doctor immediately  Keene BreathKathryn Rine, OTR/L

## 2014-05-12 ENCOUNTER — Ambulatory Visit: Payer: Medicaid Other | Admitting: Occupational Therapy

## 2015-03-21 ENCOUNTER — Inpatient Hospital Stay (HOSPITAL_COMMUNITY)
Admission: AD | Admit: 2015-03-21 | Discharge: 2015-03-24 | DRG: 885 | Disposition: A | Discharge status: Home / Self Care | Payer: Medicaid Other | Attending: Psychiatry | Admitting: Psychiatry

## 2015-03-21 ENCOUNTER — Encounter (HOSPITAL_COMMUNITY): Payer: Self-pay | Admitting: *Deleted

## 2015-03-21 DIAGNOSIS — F329 Major depressive disorder, single episode, unspecified: Secondary | ICD-10-CM | POA: Diagnosis present

## 2015-03-21 DIAGNOSIS — Z813 Family history of other psychoactive substance abuse and dependence: Secondary | ICD-10-CM | POA: Diagnosis not present

## 2015-03-21 DIAGNOSIS — F411 Generalized anxiety disorder: Secondary | ICD-10-CM | POA: Diagnosis present

## 2015-03-21 DIAGNOSIS — F17210 Nicotine dependence, cigarettes, uncomplicated: Secondary | ICD-10-CM | POA: Diagnosis present

## 2015-03-21 DIAGNOSIS — F331 Major depressive disorder, recurrent, moderate: Secondary | ICD-10-CM | POA: Diagnosis present

## 2015-03-21 DIAGNOSIS — F909 Attention-deficit hyperactivity disorder, unspecified type: Secondary | ICD-10-CM | POA: Diagnosis present

## 2015-03-21 DIAGNOSIS — G4700 Insomnia, unspecified: Secondary | ICD-10-CM | POA: Diagnosis present

## 2015-03-21 DIAGNOSIS — F410 Panic disorder [episodic paroxysmal anxiety] without agoraphobia: Secondary | ICD-10-CM | POA: Diagnosis present

## 2015-03-21 LAB — CBC
HEMATOCRIT: 41 % (ref 36.0–46.0)
Hemoglobin: 13.8 g/dL (ref 12.0–15.0)
MCH: 31.4 pg (ref 26.0–34.0)
MCHC: 33.7 g/dL (ref 30.0–36.0)
MCV: 93.4 fL (ref 78.0–100.0)
Platelets: 288 10*3/uL (ref 150–400)
RBC: 4.39 MIL/uL (ref 3.87–5.11)
RDW: 12.2 % (ref 11.5–15.5)
WBC: 8.8 10*3/uL (ref 4.0–10.5)

## 2015-03-21 LAB — RAPID URINE DRUG SCREEN, HOSP PERFORMED
AMPHETAMINES: NOT DETECTED
BARBITURATES: NOT DETECTED
BENZODIAZEPINES: NOT DETECTED
COCAINE: NOT DETECTED
Opiates: NOT DETECTED
TETRAHYDROCANNABINOL: POSITIVE — AB

## 2015-03-21 LAB — COMPREHENSIVE METABOLIC PANEL
ALT: 18 U/L (ref 14–54)
AST: 20 U/L (ref 15–41)
Albumin: 4.3 g/dL (ref 3.5–5.0)
Alkaline Phosphatase: 45 U/L (ref 38–126)
Anion gap: 9 (ref 5–15)
BILIRUBIN TOTAL: 0.5 mg/dL (ref 0.3–1.2)
BUN: 10 mg/dL (ref 6–20)
CO2: 26 mmol/L (ref 22–32)
Calcium: 9.4 mg/dL (ref 8.9–10.3)
Chloride: 105 mmol/L (ref 101–111)
Creatinine, Ser: 0.89 mg/dL (ref 0.44–1.00)
GFR calc Af Amer: >60 mL/min (ref >60)
Glucose, Bld: 110 mg/dL — ABNORMAL HIGH (ref 65–99)
Potassium: 3.8 mmol/L (ref 3.5–5.1)
Sodium: 140 mmol/L (ref 135–145)
Total Protein: 7.1 g/dL (ref 6.5–8.1)

## 2015-03-21 LAB — ETHANOL: Alcohol, Ethyl (B): <5 mg/dL (ref <5)

## 2015-03-21 LAB — TSH: TSH: 0.499 u[IU]/mL (ref 0.350–4.500)

## 2015-03-21 MED ORDER — HYDROXYZINE HCL 25 MG PO TABS
25.0000 mg | ORAL_TABLET | Freq: Four times a day (QID) | ORAL | Status: DC | PRN
  Administered 2015-03-21 – 2015-03-23 (×3): 25 mg via ORAL
  Filled 2015-03-21 (×3): qty 1

## 2015-03-21 MED ORDER — ACETAMINOPHEN 325 MG PO TABS: 650.0000 mg | ORAL_TABLET | Freq: Four times a day (QID) | ORAL | Status: DC | PRN

## 2015-03-21 MED ORDER — TRAZODONE HCL 50 MG PO TABS
50.0000 mg | ORAL_TABLET | Freq: Every evening | ORAL | Status: DC | PRN
  Administered 2015-03-21: 50 mg via ORAL
  Filled 2015-03-21: qty 1

## 2015-03-21 MED ORDER — MAGNESIUM HYDROXIDE 400 MG/5ML PO SUSP
30.0000 mL | Freq: Every day | ORAL | Status: DC | PRN
Start: 2015-03-21 — End: 2015-03-24

## 2015-03-21 MED ORDER — ALUM & MAG HYDROXIDE-SIMETH 200-200-20 MG/5ML PO SUSP: 30.0000 mL | ORAL | Status: DC | PRN
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
## 2015-03-21 NOTE — BHH Group Notes (Signed)
Pt attended AA meeting.  Vance Belcourt, MHT 

## 2015-03-21 NOTE — Tx Team (Signed)
Initial Interdisciplinary Treatment Plan   PATIENT STRESSORS: Financial difficulties Medication change or noncompliance Occupational concerns   PATIENT STRENGTHS: Average or above average intelligence Communication skills Motivation for treatment/growth Supportive family/friends Work skills   PROBLEM LIST: Problem List/Patient Goals Date to be addressed Date deferred Reason deferred Estimated date of resolution  Medication stabilization 03/21/2015     Depression 03/21/2015     Loss of income 03/21/2015     Anxiety 03/21/2015                                    DISCHARGE CRITERIA:  Improved stabilization in mood, thinking, and/or behavior Need for constant or close observation no longer present Reduction of life-threatening or endangering symptoms to within safe limits Verbal commitment to aftercare and medication compliance  PRELIMINARY DISCHARGE PLAN: Outpatient therapy Return to previous living arrangement  PATIENT/FAMIILY INVOLVEMENT: This treatment plan has been presented to and reviewed with the patient, [name].  The patient and family have been given the opportunity to ask questions and make suggestions.  Cranford Mon 03/21/2015, 5:02 PM

## 2015-03-21 NOTE — Progress Notes (Signed)
Admission note:  Patient is a 30 yo female what presented as a walk in with her mother.  Patient is having severe symptoms of anxiety and depression.  Patient currently sees Dr. Omelia Blackwater for medication management.  Patient states she was taking 60 mg vyvance and was switched to 25 mg adderall.  She was diagnosed with ADHD when she was in the 5th grade.  She was also taken off her xanax since she was not taking every day.  She feels she needs to take the medication, however.  Patient states she is having trouble concentrating; she is experiencing severe depression.  She states she has crying speels, social withdrawal, loss of interest in usual pleasures.  She rates her depression, hopelessness and anxiety as a 10.  She denies any SI/HI/AVH.  This past Thursday, she took a friend's oxycotin with her klonopin and lexapro and passed out.  She states she recently lost her job and is living with her dad and 30 yo daughter.  She states she has lost 2 good jobs due to not being able to function well at work (crying spells in the bathroom).  Patient has not pertinent medical hx.  She was oriented to room and unit.  This is her first psyche admission.

## 2015-03-22 ENCOUNTER — Encounter (HOSPITAL_COMMUNITY): Payer: Self-pay | Admitting: Psychiatry

## 2015-03-22 DIAGNOSIS — F909 Attention-deficit hyperactivity disorder, unspecified type: Secondary | ICD-10-CM | POA: Diagnosis present

## 2015-03-22 DIAGNOSIS — F411 Generalized anxiety disorder: Secondary | ICD-10-CM

## 2015-03-22 DIAGNOSIS — F331 Major depressive disorder, recurrent, moderate: Principal | ICD-10-CM

## 2015-03-22 DIAGNOSIS — F410 Panic disorder [episodic paroxysmal anxiety] without agoraphobia: Secondary | ICD-10-CM

## 2015-03-22 MED ORDER — ESCITALOPRAM OXALATE 10 MG PO TABS
10.0000 mg | ORAL_TABLET | Freq: Every day | ORAL | Status: DC
  Administered 2015-03-22 – 2015-03-24 (×3): 10 mg via ORAL
  Filled 2015-03-22 (×6): qty 1

## 2015-03-22 MED ORDER — NICOTINE POLACRILEX 2 MG MT GUM: 2.0000 mg | CHEWING_GUM | OROMUCOSAL | Status: DC | PRN

## 2015-03-22 MED ORDER — ALPRAZOLAM 0.25 MG PO TABS
0.2500 mg | ORAL_TABLET | Freq: Three times a day (TID) | ORAL | Status: DC | PRN
  Administered 2015-03-22: 0.25 mg via ORAL
  Filled 2015-03-22 (×2): qty 1

## 2015-03-22 MED ORDER — LAMOTRIGINE 25 MG PO TABS
25.0000 mg | ORAL_TABLET | Freq: Every day | ORAL | Status: DC
Start: 2015-03-22 — End: 2015-03-24
  Administered 2015-03-22 – 2015-03-24 (×3): 25 mg via ORAL
  Filled 2015-03-22 (×6): qty 1

## 2015-03-22 MED ORDER — LISDEXAMFETAMINE DIMESYLATE 30 MG PO CAPS
30.0000 mg | ORAL_CAPSULE | Freq: Every day | ORAL | Status: DC
  Administered 2015-03-22 – 2015-03-24 (×3): 30 mg via ORAL
  Filled 2015-03-22 (×3): qty 1

## 2015-03-22 NOTE — Progress Notes (Signed)
D. Pt had been up and visible in milieu this evening, did attend and participate in evening group activity. Pt spoke about on-going issues with anxiety and depression and spoke about how she had been taking xanax but was stopped a couple weeks ago and spoke about how she does not feel her medications are working properly and felt she needed to come in to help address that. Pt has appeared anxious and fidgety this evening and did request medications to assist with insomnia and anxiety. A. Support provided, medication education provided. R. Safety maintained, will continue to monitor.
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
## 2015-03-22 NOTE — BHH Suicide Risk Assessment (Signed)
[facility] INPATIENT:  Family/Significant Other Suicide Prevention Education  Suicide Prevention Education:  Contact Attempts: [name] (pt's mother) [phone] has been identified by the patient as the family member/significant other with whom the patient will be residing, and identified as the person(s) who will aid the patient in the event of a mental health crisis.  With written consent from the patient, two attempts were made to provide suicide prevention education, prior to and/or following the patient's discharge.  We were unsuccessful in providing suicide prevention education.  A suicide education pamphlet was given to the patient to share with family/significant other.  Date and time of first attempt: 4:15PM on 03/22/15 Date and time of second attempt: 03/23/15 at 3:05PM (unable to leave voicemail)   Smart, Kathleen Paulick LCSW 03/22/2015, 4:18 PM

## 2015-03-22 NOTE — BHH Suicide Risk Assessment (Signed)
[facility] Admission Suicide Risk Assessment   Nursing information obtained from:    Demographic factors:    Current Mental Status:    Loss Factors:    Historical Factors:    Risk Reduction Factors:     Total Time spent with patient: 45 minutes Principal Problem: MDD (major depressive disorder) (HCC) Diagnosis:   Patient Active Problem List   Diagnosis Date Noted  . GAD (generalized anxiety disorder) [F41.1] 03/22/2015  . Panic attacks [F41.0] 03/22/2015  . MDD (major depressive disorder) (HCC) [F32.9] 03/21/2015   Subjective Data: see admission H and P  Continued Clinical Symptoms:  Alcohol Use Disorder Identification Test Final Score (AUDIT): 0 The "Alcohol Use Disorders Identification Test", Guidelines for Use in Primary Care, Second Edition.  World Science writer [facility]). Score between 0-7:  no or low risk or alcohol related problems. Score between 8-15:  moderate risk of alcohol related problems. Score between 16-19:  high risk of alcohol related problems. Score 20 or above:  warrants further diagnostic evaluation for alcohol dependence and treatment.   CLINICAL FACTORS:   Severe Anxiety and/or Agitation Depression:   Impulsivity   Psychiatric Specialty Exam: ROS  Blood pressure 100/64, pulse 78, temperature 98.4 F (36.9 C), temperature source Oral, resp. rate 18, height  (1.676 m), weight 61.236 kg (135 lb).Body mass index is 21.8 kg/(m^2).   COGNITIVE FEATURES THAT CONTRIBUTE TO RISK:  None    SUICIDE RISK:   Mild:  Suicidal ideation of limited frequency, intensity, duration, and specificity.  There are no identifiable plans, no associated intent, mild dysphoria and related symptoms, good self-control (both objective and subjective assessment), few other risk factors, and identifiable protective factors, including available and accessible social support.  PLAN OF CARE: see admission H and P  I certify that inpatient services furnished can reasonably be  expected to improve the patient's condition.   Rachael Fee, MD 03/22/2015, 5:09 PM
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
## 2015-03-22 NOTE — BHH Counselor (Signed)
Adult Comprehensive Assessment  Patient ID: [name], female   DOB: 12-Nov-1985, 30 y.o.   MRN: [id number]  Information Source: Information source: Patient  Current Stressors:  Educational / Learning stressors: 1 year college at [employer] / Job issues: quit job Nov 2016 due to anxiety Family Relationships: supportive parents, boyfriend and 28 yo daughter. Financial / Lack of resources (include bankruptcy): support from father Housing / Lack of housing: lives with father and 60 yo daughter Physical health (include injuries & life threatening diseases): ADD; Post Pardum Depression Social relationships: "I lost alot of friends." Family Substance abuse: marijuana use "at night." Bereavement / Loss: none identified  Living/Environment/Situation:  Living Arrangements: Parent Living conditions (as described by patient or guardian): living with father and her 32 yo daughter. "my mom and dad recently separated." How long has patient lived in current situation?: several months What is atmosphere in current home: Temporary, Comfortable, Loving  Family History:  Marital status: Long term relationship Long term relationship, how long?: 8 years What types of issues is patient dealing with in the relationship?: don't live together. great father to our child Additional relationship information: n/a Are you sexually active?: Yes What is your sexual orientation?: heterosexual Has your sexual activity been affected by drugs, alcohol, medication, or emotional stress?: no Does patient have children?: Yes How many children?: 1 How is patient's relationship with their children?: 24 year old daughter. "she is my rock."  Childhood History:  By whom was/is the patient raised?: Both parents Additional childhood history information: both mom and dad drank heavily during pt's childhood Description of patient's relationship with caregiver when they were a child: close to both parents Patient's  description of current relationship with people who raised him/her: close to both parents. lives with her father How were you disciplined when you got in trouble as a child/adolescent?: yelled at; talked to Does patient have siblings?: Yes Number of Siblings: 1 Description of patient's current relationship with siblings: one little brother-"he drinks heavily too." Did patient suffer any verbal/emotional/physical/sexual abuse as a child?: No Did patient suffer from severe childhood neglect?: No Has patient ever been sexually abused/assaulted/raped as an adolescent or adult?: No Was the patient ever a victim of a crime or a disaster?: No Witnessed domestic violence?: No Has patient been effected by domestic violence as an adult?: No (n/a)  Education:  Highest grade of school patient has completed: 1 year college at [employer] Currently a Consulting civil engineer?: No Learning disability?: No  Employment/Work Situation:   Employment situation: Unemployed Patient's job has been impacted by current illness: Yes Describe how patient's job has been impacted: unmanageable anxiety and panic What is the longest time patient has a held a job?: 4 years 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 Has patient ever been in the [employer]?: No Has patient ever served in combat?: No Did You Receive Any Psychiatric Treatment/Services While in Equities trader?: No Are There Guns or Education officer, community in Your Home?: No Are These Comptroller?:  (n/a)  Financial Resources:   Surveyor, quantity resources: [employer], Income from spouse, Support from parents / caregiver Does patient have a Lawyer or guardian?: No  Alcohol/Substance Abuse:   What has been your use of drugs/alcohol within the last 12 months?: marijuana use "to help me relax and sleep." pt reports no other substance use other than taking prescribed medications. If attempted suicide, did drugs/alcohol play a role in this?: No (pt  reports that she accidently overdosed on medication)  Alcohol/Substance Abuse Treatment Hx: Denies past history If yes, describe treatment: n/a Has alcohol/substance abuse ever caused legal problems?: No  Social Support System:   Forensic psychologist System: Fair Museum/gallery exhibitions officer System: "mainly my family." not many friends "I isolate due to my anxiety." Type of faith/religion: n/a  How does patient's faith help to cope with current illness?: n/a   Leisure/Recreation:   Leisure and Hobbies: playing with her daughter; spending time with family  Strengths/Needs:   What things does the patient do well?: motivated to get anxiety and panic under control. In what areas does patient struggle / problems for patient: dealing with anxiety; coping with panic sx. depression  Discharge Plan:   Does patient have access to transportation?: Yes Will patient be returning to same living situation after discharge?: Yes Currently receiving community mental health services: Yes (From Whom) (Dr. Omelia Blackwater) If no, would patient like referral for services when discharged?: Yes (What county[place] Medical sales [place]) Does patient have financial barriers related to discharge medications?: No ([place] Medicaid)  Summary/Recommendations:   Summary and Recommendations (to be completed by the evaluator): Patient is 30 year old female living in [place], [place] with her father and daughter. She presents to the hospital seeking treatment for unmanaged anxiety; panic; increased depression, and for medication stabilization. Patient denies SI/HI/AVH. Patient requests referral for counseling and currently sees Dr. Omelia Blackwater for medication management. Recommendations for patient include: crisis stabilization, therapeutic milieu, encourage group attendance and participation, and development of comprehensive mental wellness plan.   Smart, Xitlalli Newhard LCSW 03/22/2015 1:51 PM

## 2015-03-22 NOTE — BHH Group Notes (Signed)
Pt attended AA meeting.  Kimari Lienhard, MHT 

## 2015-03-22 NOTE — BHH Group Notes (Signed)
B[facility] Group Notes:  (Nursing/MHT/Case Management/Adjunct)  Date:  03/22/2015  Time:  10:04 AM  Type of Therapy:  Recovery  Participation Level:  Active  Participation Quality:  Appropriate, Attentive, Sharing and Supportive  Affect:  Appropriate  Cognitive:  Alert, Appropriate and Oriented  Insight:  Appropriate  Engagement in Group:  Engaged and Supportive  Modes of Intervention:  Discussion and Support  Summary of Progress/Problems:  [name] 03/22/2015, 10:04 AM

## 2015-03-22 NOTE — BHH Group Notes (Signed)
BHH LCSW Group Therapy  03/22/2015 1:18 PM  Type of Therapy:  Group Therapy  Participation Level:  Active  Participation Quality:  Attentive  Affect:  Appropriate  Cognitive:  Alert and Oriented  Insight:  Improving  Engagement in Therapy:  Improving  Modes of Intervention:  Confrontation, Discussion, Education, Problem-solving, Rapport Building, Socialization and Support  Summary of Progress/Problems: MHA Speaker came to talk about his personal journey with substance abuse and addiction. The pt processed ways by which to relate to the speaker. MHA speaker provided handouts and educational information pertaining to groups and services offered by the [facility].   Smart, Charlestine Rookstool LCSW 03/22/2015, 1:18 PM

## 2015-03-22 NOTE — Progress Notes (Signed)
D: Patient denies SI/HI/AVH. Patient rates hopelessness as 8, anxiety as 10, and depression as 10.  Patient affect and mood are anxious  Pt states that she slept fair and her appetite is poor.  Pt states that her energy level is low and her ability to pay attention is poor. Patient did attend  group. Patient visible on the milieu. No distress noted. A: Support and encouragement offered. Scheduled medications given to pt. Q 15 min checks continued for patient safety. R: Patient receptive. Patient remains safe on the unit.

## 2015-03-23 NOTE — Progress Notes (Signed)
D. Pt had been up and visible in milieu this evening, did attend evening group activity. Pt spoke of her day and spoke about various medication changes and appears satisfied with those changes. Pt also spoke of on-going anxiety and depression requested and received medications to assist with anxiety. A. Support provided, medication education given. R. Pt verbalized understanding, safety maintained.

## 2015-03-23 NOTE — Progress Notes (Signed)
Pt attended NA group meeting this evening.   

## 2015-03-23 NOTE — B[facility] Group Notes (Signed)
[facility] LCSW Group Therapy  03/23/2015 3:07 PM  Type of Therapy:  Group Therapy  Participation Level:  Active  Participation Quality:  Attentive and Redirectable  Affect:  Appropriate  Cognitive:  Alert and Oriented  Insight:  Improving  Engagement in Therapy:  Distracting and Engaged "was asked to stop side conversations"   Modes of Intervention:  Confrontation, Discussion, Education, Exploration, Problem-solving, Rapport Building, Socialization and Support  Summary of Progress/Problems: Feelings around Diagnosis: Rufina was attentive and engaged during today's processing group. She shared that she is happy to be "diagnosed correctly" with bipolar disorder because "now we have a plan for how to treat my symptoms." Anarosa shared that with counseling and medication management, she is confident that she will be able to manage her symptoms and practice healthy coping skills. "I'm lucky in that I have a really supportive family and they are there for me."   Smart, Iwao Shamblin LCSW 03/23/2015, 3:07 PM

## 2015-03-23 NOTE — Progress Notes (Signed)
[facility] MD Progress Note  03/23/2015 8:53 PM [name]  MRN:  [id number] Subjective:  [name] states she is starting to feel better. States that the combination of medications as she has them now is working. Feels that if she was to continue to feel this way once he gets home she is going to be able to make it. States she needs to rebuild her life Principal Problem: MDD (major depressive disorder) (HCC) Diagnosis:   Patient Active Problem List   Diagnosis Date Noted  . GAD (generalized anxiety disorder) [F41.1] 03/22/2015  . Panic attacks [F41.0] 03/22/2015  . Attention deficit hyperactivity disorder (ADHD) [F90.9] 03/22/2015  . Attention deficit hyperactivity disorder (ADHD) [F90.9] 03/22/2015  . MDD (major depressive disorder) (HCC) [F32.9] 03/21/2015   Total Time spent with patient: 20 minutes  Past Psychiatric History: see admission H and P  Past Medical History:  Past Medical History  Diagnosis Date  . Elbow fracture, right   . Elbow fracture, left     Past Surgical History  Procedure Laterality Date  . Appendectomy    . Tonsillectomy     Family History: History reviewed. No pertinent family history. Family Psychiatric  History: see admission H and P Social History:  History  Alcohol Use  . Yes    Comment: occ     History  Drug Use No    Social History   Social History  . Marital Status: Single    Spouse Name: N/A  . Number of Children: N/A  . Years of Education: N/A   Social History Main Topics  . Smoking status: Current Every Day Smoker -- 0.50 packs/day  . Smokeless tobacco: None  . Alcohol Use: Yes     Comment: occ  . Drug Use: No  . Sexual Activity: Yes    Birth Control/ Protection: IUD   Other Topics Concern  . None   Social History Narrative   Additional Social History:    Pain Medications: has used Oxycontin not prescribed to her History of alcohol / drug use?: Yes Longest period of sobriety (when/how long): unk Negative  Consequences of Use:  (overdosed last week) Name of Substance 1: marijuana 1 - Age of First Use: 20s 1 - Amount (size/oz): variable 1 - Frequency: 1-2x/moth 1 - Duration: years 1 - Last Use / Amount: last week                  Sleep: Fair  Appetite:  Fair  Current Medications: Current Facility-Administered Medications  Medication Dose Route Frequency Provider Last Rate Last Dose  . acetaminophen (TYLENOL) tablet 650 mg  650 mg Oral Q6H PRN Thermon Leyland, NP      . ALPRAZolam Prudy Feeler) tablet 0.25 mg  0.25 mg Oral TID PRN Rachael Fee, MD   0.25 mg at 03/22/15 2142  . alum & mag hydroxide-simeth (MAALOX/MYLANTA) 200-200-20 MG/5ML suspension 30 mL  30 mL Oral Q4H PRN Thermon Leyland, NP      . escitalopram (LEXAPRO) tablet 10 mg  10 mg Oral Daily Rachael Fee, MD   10 mg at 03/23/15 8657  . hydrOXYzine (ATARAX/VISTARIL) tablet 25 mg  25 mg Oral Q6H PRN Thermon Leyland, NP   25 mg at 03/22/15 2336  . lamoTRIgine (LAMICTAL) tablet 25 mg  25 mg Oral Daily Rachael Fee, MD   25 mg at 03/23/15 8469  . lisdexamfetamine (VYVANSE) capsule 30 mg  30 mg Oral Daily Rachael Fee, MD   30  mg at 03/23/15 0809  . magnesium hydroxide (MILK OF MAGNESIA) suspension 30 mL  30 mL Oral Daily PRN Thermon Leyland, NP      . nicotine polacrilex (NICORETTE) gum 2 mg  2 mg Oral PRN Rachael Fee, MD        Lab Results: No results found for this or any previous visit (from the past 48 hour(s)).  Blood Alcohol level:  Lab Results  Component Value Date   ETH <5 03/21/2015    Physical Findings: AIMS: Facial and Oral Movements Muscles of Facial Expression: None, normal Lips and Perioral Area: None, normal Jaw: None, normal Tongue: None, normal,Extremity Movements Upper (arms, wrists, hands, fingers): None, normal Lower (legs, knees, ankles, toes): None, normal, Trunk Movements Neck, shoulders, hips: None, normal, Overall Severity Severity of abnormal movements (highest score from questions above):  None, normal Incapacitation due to abnormal movements: None, normal Patient's awareness of abnormal movements (rate only patient's report): No Awareness, Dental Status Current problems with teeth and/or dentures?: No Does patient usually wear dentures?: No  CIWA:    COWS:     Musculoskeletal: Strength & Muscle Tone: within normal limits Gait & Station: normal Patient leans: normal  Psychiatric Specialty Exam: Review of Systems  Constitutional: Negative.   HENT: Negative.   Eyes: Negative.   Respiratory: Negative.   Cardiovascular: Negative.   Gastrointestinal: Negative.   Genitourinary: Negative.   Musculoskeletal: Negative.   Skin: Negative.   Neurological: Negative.   Endo/Heme/Allergies: Negative.   Psychiatric/Behavioral: Positive for depression. The patient is nervous/anxious.     Blood pressure 105/85, pulse 85, temperature 98 F (36.7 C), temperature source Oral, resp. rate 16, height  (1.676 m), weight 61.236 kg (135 lb).Body mass index is 21.8 kg/(m^2).  General Appearance: Fairly Groomed  Patent attorney::  Fair  Speech:  Clear and Coherent  Volume:  Normal  Mood:  Anxious worried the meds might quit working as well  Affect:  appropriate  Thought Process:  Coherent and Goal Directed  Orientation:  Full (Time, Place, and Person)  Thought Content:  symptoms events worries concerns  Suicidal Thoughts:  No  Homicidal Thoughts:  No  Memory:  Immediate;   Fair Recent;   Fair Remote;   Fair  Judgement:  Fair  Insight:  Present  Psychomotor Activity:  Normal  Concentration:  Fair  Recall:  [employer] of Knowledge:Fair  Language: Fair  Akathisia:  No  Handed:  Right  AIMS (if indicated):     Assets:  Desire for Improvement Housing Social Support  ADL's:  Intact  Cognition: WNL  Sleep:  Number of Hours: 5.5   Treatment Plan Summary: Daily contact with patient to assess and evaluate symptoms and progress in treatment and Medication management Supportive  approach/coping skills Depression; continue the Lexapro 10 mg daily Anxiety; continue the Xanax  Mood instability; continue the Lamictal 25 mg daily ADHD; continue the Vyvanse 30 mg Work with CBT/mindfulness [name] A, MD 03/23/2015, 8:53 PM

## 2015-03-23 NOTE — Progress Notes (Signed)
D-  Patient denies SI, HI and AVH.  Patient reported feelings of anxiety this shift, but states it is manageable.  Patient has been participating in groups and unit activities.  A- Assess patient for safety, offer medications as prescribed,  R-  Patient able to contract for safety.

## 2015-03-23 NOTE — [facility] Group Notes (Signed)
[facility] LCSW Aftercare Discharge Planning Group Note   03/23/2015 10:14 AM  Participation Quality:  Invited. DID NOT ATTEND. Pt chose to rest in room.   Smart, Nurah Petrides LCSW

## 2015-03-23 NOTE — Progress Notes (Signed)
Recreation Therapy Notes  Date: 03.01.2017 Time: 9:30am Location: [address] Group Room   Group Topic: Stress Management  Goal Area(s) Addresses:  Patient will actively participate in stress management techniques presented during session.   Behavioral Response: Did not attend.   Julena Barbour L Iantha Titsworth, LRT/CTRS        Mckinley Adelstein L 03/23/2015 11:58 AM 

## 2015-03-24 DIAGNOSIS — F331 Major depressive disorder, recurrent, moderate: Secondary | ICD-10-CM | POA: Insufficient documentation

## 2015-03-24 MED ORDER — ALPRAZOLAM 0.25 MG PO TABS: 0.2500 mg | ORAL_TABLET | Freq: Three times a day (TID) | ORAL | Status: AC | PRN

## 2015-03-24 MED ORDER — LISDEXAMFETAMINE DIMESYLATE 30 MG PO CAPS: 30.0000 mg | ORAL_CAPSULE | Freq: Every day | ORAL | Status: AC

## 2015-03-24 MED ORDER — ESCITALOPRAM OXALATE 10 MG PO TABS: 10.0000 mg | ORAL_TABLET | Freq: Every day | ORAL | Status: AC

## 2015-03-24 MED ORDER — HYDROXYZINE HCL 25 MG PO TABS: 25.0000 mg | ORAL_TABLET | Freq: Four times a day (QID) | ORAL | Status: AC | PRN

## 2015-03-24 MED ORDER — LAMOTRIGINE 25 MG PO TABS: 25.0000 mg | ORAL_TABLET | Freq: Every day | ORAL | Status: AC

## 2015-03-24 MED ORDER — NICOTINE POLACRILEX 2 MG MT GUM: 2.0000 mg | CHEWING_GUM | OROMUCOSAL | Status: AC | PRN

## 2015-03-24 NOTE — Progress Notes (Signed)
  [facility] Adult Case Management Discharge Plan :  Will you be returning to the same living situation after discharge:  Yes,  home At discharge, do you have transportation home?: Yes,  family  Do you have the ability to pay for your medications: Yes,  [place]  Release of information consent forms completed and submitted to medical records by CSW.  Patient to Follow up at: Follow-up Information    Follow up with [facility] On 03/24/2015.   Why:  The office computer system is down. Aurea Graff from this agency will call you today with appt time and date. She already has your information.Thank you!    Contact information:   [facility] [address] [place] [address] Phone: [phone] Fax: [phone]       Follow up with [facility] On 04/13/2015.   Why:  Appt on this date for medication management with Crystal, NP at 3:00PM. Please bring Photo ID and Medicaid card to this appt. Thank you.    Contact information:   [address], [place] [address] Phone: [phone] Fax: [phone]      Next level of care provider has access to [facility] Link:no  Safety Planning and Suicide Prevention discussed: Yes,  SPE completed with pt. Contact attempts made with pt's mother. Pt also provided with [facility] pamphlet and mobile crisis information.  Have you used any form of tobacco in the last 30 days? (Cigarettes, Smokeless Tobacco, Cigars, and/or Pipes): Yes  Has patient been referred to the Quitline?: Patient refused referral  Patient has been referred for addiction treatment: N/A  Smart, Adith Tejada LCSW 03/24/2015, 10:49 AM
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
## 2015-03-24 NOTE — [facility] Group Notes (Signed)
Patient attended and participated in nursing education regarding positive lifestyle and leisure choices.   

## 2015-03-24 NOTE — Progress Notes (Signed)
D. Pt had been up and visible in milieu this evening, did attend evening group activity. Pt spoke about how she feels better since coming in and spoke about how she is to be discharged in the morning. Pt spoke about how she feels ready to go and feels her medications are working ok. Pt received medication to help with sleep and did not verbalize any complaints of pain this evening. A. Support and encouragement provided. R. Safety maintained, will continue to monitor.

## 2015-03-24 NOTE — [facility] Suicide Risk Assessment (Signed)
[facility] Discharge Suicide Risk Assessment   Principal Problem: MDD (major depressive disorder) [facility]) Discharge Diagnoses:  Patient Active Problem List   Diagnosis Date Noted  . GAD (generalized anxiety disorder) [F41.1] 03/22/2015  . Panic attacks [F41.0] 03/22/2015  . Attention deficit hyperactivity disorder (ADHD) [F90.9] 03/22/2015  . Attention deficit hyperactivity disorder (ADHD) [F90.9] 03/22/2015  . MDD (major depressive disorder) (HCC) [F32.9] 03/21/2015    Total Time spent with patient: 20 minutes  Musculoskeletal: Strength & Muscle Tone: within normal limits Gait & Station: normal Patient leans: normal  Psychiatric Specialty Exam: Review of Systems  Constitutional: Negative.   HENT: Negative.   Eyes: Negative.   Respiratory: Negative.   Cardiovascular: Negative.   Gastrointestinal: Negative.   Genitourinary: Negative.   Musculoskeletal: Negative.   Skin: Negative.   Neurological: Negative.   Endo/Heme/Allergies: Negative.   Psychiatric/Behavioral: The patient is nervous/anxious.     Blood pressure 104/73, pulse 80, temperature 97.8 F (36.6 C), temperature source Oral, resp. rate 16, height  (1.676 m), weight 61.236 kg (135 lb).Body mass index is 21.8 kg/(m^2).  General Appearance: Fairly Groomed  Patent attorney::  Fair  Speech:  Clear and Coherent409  Volume:  Normal  Mood:  Euthymic  Affect:  Appropriate  Thought Process:  Coherent and Goal Directed  Orientation:  Full (Time, Place, and Person)  Thought Content:  symptoms events worries concerns  Suicidal Thoughts:  No  Homicidal Thoughts:  No  Memory:  Immediate;   Fair Recent;   Fair Remote;   Fair  Judgement:  Fair  Insight:  Present  Psychomotor Activity:  Normal  Concentration:  Fair  Recall:  [employer] of Knowledge:Fair  Language: Fair  Akathisia:  No  Handed:  Right  AIMS (if indicated):     Assets:  Desire for Improvement Housing Social Support  Sleep:  Number of Hours: 5.5   Cognition: WNL  ADL's:  Intact  In full contact with reality. There are no active SI plans or intent. She is willing and motivated to pursue further outpatient treatment Mental Status Per Nursing Assessment::   On Admission:     Demographic Factors:  Caucasian  Loss Factors: none identified  Historical Factors: none identified  Risk Reduction Factors:   Sense of responsibility to family, Living with another person, especially a relative and Positive social support  Continued Clinical Symptoms:  Depression:   Insomnia  Cognitive Features That Contribute To Risk:  None    Suicide Risk:  Minimal: No identifiable suicidal ideation.  Patients presenting with no risk factors but with morbid ruminations; may be classified as minimal risk based on the severity of the depressive symptoms  Follow-up Information    Follow up with [facility] On 03/24/2015.   Why:  The office computer system is down. Aurea Graff from this agency will call you today with appt time and date. She already has your information.Thank you!    Contact information:   [facility] [address], [place] [address] Phone: [phone] Fax: [phone]       Follow up with [facility] On 04/13/2015.   Why:  Appt on this date for medication management with Crystal, NP at 3:00PM. Please bring Photo ID and Medicaid card to this appt. Thank you.    Contact information:   [address], [place] [address] Phone: [phone] Fax: [phone]      Plan Of Care/Follow-up recommendations:  Activity:  as tolerated Diet:  [facility] Sabastien Tyler A, MD 03/24/2015, 11:42 AM

## 2015-03-24 NOTE — Progress Notes (Signed)
Patient ID: [name], female   DOB: April 17, 1985, 30 y.o.   MRN: [id number] Patient was discharged to home/self care.  Patient expressed the need for sobriety upon discharge.  Patient acknowledged understanding of discharge planning.  Patient denies SI, and AVH and able to contract for safety.

## 2016-01-20 ENCOUNTER — Ambulatory Visit: Payer: Self-pay | Admitting: Family Medicine

## 2016-01-20 DIAGNOSIS — Z0289 Encounter for other administrative examinations: Secondary | ICD-10-CM
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
## 2016-12-17 ENCOUNTER — Ambulatory Visit: Payer: Self-pay | Admitting: Family Medicine
